# Patient Record
Sex: Female | Born: 1990 | Race: White | Hispanic: No | Marital: Single | Smoking: Former smoker
Health system: Southern US, Community
[De-identification: ages and names within clinical notes are randomized; demographics above are authoritative.]

## PROBLEM LIST (undated history)

## (undated) DIAGNOSIS — R519 Headache, unspecified: Secondary | ICD-10-CM

## (undated) DIAGNOSIS — R51 Headache: Secondary | ICD-10-CM

## (undated) HISTORY — PX: MOUTH SURGERY: SHX715

---
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
## 2012-03-02 NOTE — L&D Delivery Note (Signed)
Delivery Note At 7:19 PM a viable female, "Jaxson", was delivered via Vaginal, Spontaneous Delivery (Presentation: ; Occiput Anterior).  APGAR: 9, 9; weight .   Placenta status: Intact, Spontaneous.  Cord: 3 vessels with the following complications: .  Cord pH: NA Foul odor noted at delivery, with MSF noted. Infant vigorous at delivery, with no respiratory distress noted.  Anesthesia: Epidural  Episiotomy: None Lacerations: 2nd degree Suture Repair: 3.0 monocry Est. Blood Loss (mL): 200  Mom to postpartum.  Baby to skin to skin. Temp 101.8 at 6:36pm, with Unasyn 3 gm at 6:58p and Tylenol 650 mg po at 6:39p. Temp 99.8 at 7:45p. Per consult with Dr. Pennie Rushing, with observe pp temp status--would implement 24 hours of ATB if temp persists 4 hours pp. Outpatient circumcision planned. Placenta to pathology due to chorioamnionitis.  Nigel Bridgeman 09/17/2012, 7:59 PM

## 2012-04-22 ENCOUNTER — Ambulatory Visit (INDEPENDENT_AMBULATORY_CARE_PROVIDER_SITE_OTHER): Payer: BC Managed Care – PPO | Admitting: Emergency Medicine

## 2012-04-22 VITALS — BP 115/69 | HR 112 | Temp 98.1°F | Resp 16 | Ht 64.0 in | Wt 137.0 lb

## 2012-04-22 DIAGNOSIS — J209 Acute bronchitis, unspecified: Secondary | ICD-10-CM

## 2012-04-22 DIAGNOSIS — J018 Other acute sinusitis: Secondary | ICD-10-CM

## 2012-04-22 MED ORDER — CEFPROZIL 500 MG PO TABS: 500.0000 mg | ORAL_TABLET | Freq: Two times a day (BID) | ORAL | Status: AC

## 2012-04-22 NOTE — Patient Instructions (Signed)

## 2012-04-22 NOTE — Progress Notes (Signed)
[facility] [address] [place] [address] [phone]  Date:  04/22/2012   Name:  Shawanna Zanders   DOB:  January 04, 1991   MRN:  [id number]  PCP:  Tally Due, MD    Chief Complaint: Cough, Dizziness and Sore Throat   History of Present Illness:  Lindora Alviar is a 22 y.o. very pleasant female patient who presents with the following:  [redacted] weeks pregnant.  Has green nasal drainage and post nasal drip since Tuesday.  Cough productive green sputum.  No wheezing or shortness of breath.  No nausea or vomiting.  No fever or chills.  No improvement with OTC medication.  No sick contacts.  No other complaints or medical concerns.  There is no problem list on file for this patient.   Past Medical History  Diagnosis Date  . Generalized headaches     Past Surgical History  Procedure Laterality Date  . Mouth surgery      History  Substance Use Topics  . Smoking status: Current Some Day Smoker  . Smokeless tobacco: Not on file  . Alcohol Use: No     Comment: occasional    History reviewed. No pertinent family history.  No Known Allergies  Medication list has been reviewed and updated.  Current Outpatient Prescriptions on File Prior to Visit  Medication Sig Dispense Refill  . cyclobenzaprine (FLEXERIL) 10 MG tablet 1 by mouth at night as a muscular relaxant  30 tablet  0  . HYDROcodone-acetaminophen (NORCO/VICODIN) 5-325 MG per tablet Take 1 tablet by mouth every 6 (six) hours as needed for pain.  15 tablet  0  . levonorgestrel-ethinyl estradiol (NORDETTE) 0.15-30 MG-MCG tablet Take 1 tablet by mouth daily.      . ondansetron (ZOFRAN-ODT) 8 MG disintegrating tablet Take 1 tablet (8 mg total) by mouth every 8 (eight) hours as needed for nausea.  16 tablet  0  . promethazine (PHENERGAN) 25 MG tablet Take 1 tablet (25 mg total) by mouth every 6 (six) hours as needed for nausea.  10 tablet  0   No current facility-administered medications on file prior to  visit.    Review of Systems:  As per HPI, otherwise negative.    Physical Examination: Filed Vitals:   04/22/12 1638  BP: 115/69  Pulse: 112  Temp: 98.1 F (36.7 C)  Resp: 16   Filed Vitals:   04/22/12 1638  Height: 5\' 4"  (1.626 m)  Weight: 137 lb (62.143 kg)   Body mass index is 23.5 kg/(m^2). Ideal Body Weight: Weight in (lb) to have BMI = 25: 145.3  GEN: WDWN, NAD, Non-toxic, A & O x 3 HEENT: Atraumatic, Normocephalic. Neck supple. No masses, No LAD. Ears and Nose: No external deformity. CV: RRR, No M/G/R. No JVD. No thrill. No extra heart sounds. PULM: CTA B, no wheezes, crackles, rhonchi. No retractions. No resp. distress. No accessory muscle use. ABD: S, NT, ND, +BS. No rebound. No HSM. EXTR: No c/c/e NEURO Normal gait.  PSYCH: Normally interactive. Conversant. Not depressed or anxious appearing.  Calm demeanor.    Assessment and Plan: Sinusitis Bronchitis Incidental pregnancy cefzil Robitussin DM Sudafed Follow up as needed  Carmelina Dane, MD

## 2012-05-17 ENCOUNTER — Telehealth: Payer: Self-pay | Admitting: Obstetrics and Gynecology

## 2012-05-17 NOTE — Telephone Encounter (Signed)
Spoke with pt rgd msg pt states had missed call from office advised pt call was reminder of up coming appt pt voice understanding

## 2012-05-18 ENCOUNTER — Other Ambulatory Visit: Payer: Self-pay

## 2012-05-18 LAB — OB RESULTS CONSOLE HGB/HCT, BLOOD
HCT: 38 %
Hemoglobin: 12.7 g/dL

## 2012-05-18 LAB — OB RESULTS CONSOLE GC/CHLAMYDIA: Gonorrhea: NEGATIVE

## 2012-05-18 LAB — OB RESULTS CONSOLE HIV ANTIBODY (ROUTINE TESTING): HIV: NONREACTIVE

## 2012-05-19 LAB — PRENATAL PANEL VII
Antibody Screen: NEGATIVE
Basophils Absolute: 0 10*3/uL (ref 0.0–0.1)
Eosinophils Absolute: 0.1 10*3/uL (ref 0.0–0.7)
Eosinophils Relative: 1 % (ref 0–5)
Lymphocytes Relative: 20 % (ref 12–46)
MCH: 30.2 pg (ref 26.0–34.0)
MCV: 90.2 fL (ref 78.0–100.0)
Neutrophils Relative %: 71 % (ref 43–77)
Platelets: 210 10*3/uL (ref 150–400)
RDW: 13.5 % (ref 11.5–15.5)
WBC: 8.4 10*3/uL (ref 4.0–10.5)

## 2012-05-19 LAB — AFP, QUAD SCREEN
HCG, Total: 7412 m[IU]/mL
INH: 169.8 pg/mL
Interpretation-AFP: NEGATIVE
MoM for hCG: 0.55
Osb Risk: 1:17500 {titer}
Tri 18 Scr Risk Est: NEGATIVE
uE3 Mom: 0.74
uE3 Value: 1.6 ng/mL

## 2012-05-19 LAB — GC/CHLAMYDIA PROBE AMP, URINE: Chlamydia, Swab/Urine, PCR: NEGATIVE

## 2012-05-23 ENCOUNTER — Encounter: Payer: BC Managed Care – PPO | Admitting: Obstetrics

## 2012-07-03 ENCOUNTER — Ambulatory Visit (INDEPENDENT_AMBULATORY_CARE_PROVIDER_SITE_OTHER): Payer: BC Managed Care – PPO | Admitting: Family Medicine

## 2012-07-03 VITALS — BP 116/73 | HR 98 | Temp 97.8°F | Resp 16 | Ht 64.0 in | Wt 159.0 lb

## 2012-07-03 DIAGNOSIS — K5289 Other specified noninfective gastroenteritis and colitis: Secondary | ICD-10-CM

## 2012-07-03 DIAGNOSIS — K529 Noninfective gastroenteritis and colitis, unspecified: Secondary | ICD-10-CM

## 2012-07-03 DIAGNOSIS — Z3490 Encounter for supervision of normal pregnancy, unspecified, unspecified trimester: Secondary | ICD-10-CM

## 2012-07-03 MED ORDER — ONDANSETRON 4 MG PO TBDP: ORAL_TABLET | ORAL | Status: DC

## 2012-07-03 NOTE — Progress Notes (Signed)
Subjective: 22 year old pregnant female gravida 1 para 0 EDC September 15 2012. Goes to an OB/GYN group at [facility]. She was nauseated last night after going to a baseball game. She had he been a hamburger and fries. She slept okay, but this morning started started vomiting multiple times. Her last episode of vomiting was about a 1 hour ago. She took her Zofran at about 8 AM. She has gotten some water down her. No abdominal pain. No vaginal bleeding. No diarrhea.  Objective: Uncomfortable-appearing young lady. Throat clear. Tongue moist. Neck supple without nodes. Heart was tachycardic. Chest is clear. Abdomen has bowel sounds present. Abdomen is soft and nontender. She is pregnant, with a size appropriate for her with 29 weeks.  Assessment: Acute gastroenteritis, probably noro virus Intrauterine pregnancy [redacted] weeks gestation  Plan: Will go ahead and give her a Zofran here. She already has Zofran at home, but I will prescribe some Zofran ODT if she needs it. Push fluids.

## 2012-07-03 NOTE — Patient Instructions (Signed)
Drink lots of fluids. Avoid dairy products. When the stomach feels calmed down make progress diet by taking a little crackers and toast and applesauce and bananas, but progressed to more solid food only when the stomach is more calmed down. In the event of more acute abdominal pain go to the emergency room or come back in here.  Use Zofran 4 mg every 4-6 hours only if needed for nausea or vomiting

## 2012-09-11 ENCOUNTER — Encounter (HOSPITAL_COMMUNITY): Payer: Self-pay

## 2012-09-11 ENCOUNTER — Inpatient Hospital Stay (HOSPITAL_COMMUNITY)
Admission: AD | Admit: 2012-09-11 | Discharge: 2012-09-11 | Disposition: A | Discharge status: Home / Self Care | Payer: BC Managed Care – PPO | Source: Ambulatory Visit | Attending: Obstetrics and Gynecology | Admitting: Obstetrics and Gynecology

## 2012-09-11 DIAGNOSIS — O471 False labor at or after 37 completed weeks of gestation: Secondary | ICD-10-CM

## 2012-09-11 DIAGNOSIS — O479 False labor, unspecified: Secondary | ICD-10-CM | POA: Insufficient documentation

## 2012-09-11 MED ORDER — ZOLPIDEM TARTRATE 5 MG PO TABS
5.0000 mg | ORAL_TABLET | Freq: Once | ORAL | Status: AC
  Administered 2012-09-11: 5 mg via ORAL
  Filled 2012-09-11: qty 1

## 2012-09-11 MED ORDER — OXYCODONE-ACETAMINOPHEN 5-325 MG PO TABS
2.0000 | ORAL_TABLET | Freq: Once | ORAL | Status: AC
  Administered 2012-09-11: 2 via ORAL
  Filled 2012-09-11: qty 2

## 2012-09-11 NOTE — MAU Provider Note (Signed)
History    CSN: [id number]  Arrival date and time: 09/11/12 4098   First Provider Initiated Contact with Patient 09/11/12 [phone]      Chief Complaint  Patient presents with  . Contractions   HPI Pt presents to MAU at [redacted]w[redacted]d gestation with c/o regular uterine contractions which began earlier in the day prior to admission and have increased in frequency and intensity since onset.  Denies ROM or bldg.  Reports active fetus.  Pregnancy essentially uncomplicated.  Reports some contd intermittent nausea/vomiting but has not used Zofran in the past 3 days.  Most recent SVE was closed/thick on 08/31/12.  OB History   Grav Para Term Preterm Abortions TAB SAB Ect Mult Living   1               Past Medical History  Diagnosis Date  . Generalized headaches     Past Surgical History  Procedure Laterality Date  . Mouth surgery      History reviewed. No pertinent family history.  History  Substance Use Topics  . Smoking status: Former Games developer  . Smokeless tobacco: Not on file  . Alcohol Use: No     Comment: occasional    Allergies: No Known Allergies  Prescriptions prior to admission  Medication Sig Dispense Refill  . ondansetron (ZOFRAN-ODT) 8 MG disintegrating tablet Take 1 tablet (8 mg total) by mouth every 8 (eight) hours as needed for nausea.  16 tablet  0  . cyclobenzaprine (FLEXERIL) 10 MG tablet 1 by mouth at night as a muscular relaxant  30 tablet  0  . HYDROcodone-acetaminophen (NORCO/VICODIN) 5-325 MG per tablet Take 1 tablet by mouth every 6 (six) hours as needed for pain.  15 tablet  0  . levonorgestrel-ethinyl estradiol (NORDETTE) 0.15-30 MG-MCG tablet Take 1 tablet by mouth daily.      . ondansetron (ZOFRAN ODT) 4 MG disintegrating tablet Use every 4-6 hours if needed for nausea and vomiting  12 tablet  0  . promethazine (PHENERGAN) 25 MG tablet Take 1 tablet (25 mg total) by mouth every 6 (six) hours as needed for nausea.  10 tablet  0    Review of Systems   Constitutional: Negative.   HENT: Negative.   Eyes: Negative.   Respiratory: Negative.   Cardiovascular: Negative.   Gastrointestinal: Negative.   Genitourinary: Negative.   Musculoskeletal: Negative.   Skin: Negative.   Neurological: Negative.   Endo/Heme/Allergies: Negative.   Psychiatric/Behavioral: Negative.    Physical Exam   Blood pressure 121/73, pulse 121, temperature 97.8 F (36.6 C), temperature source Oral, resp. rate 18, height 5\' 3"  (1.6 m), weight 79.833 kg (176 lb), last menstrual period 12/11/2011.  Physical Exam  Constitutional: She is oriented to person, place, and time. She appears well-developed and well-nourished.  HENT:  Head: Normocephalic and atraumatic.  Right Ear: External ear normal.  Left Ear: External ear normal.  Nose: Nose normal.  Eyes: Conjunctivae are normal. Pupils are equal, round, and reactive to light.  Neck: Normal range of motion. Neck supple.  Cardiovascular: Normal rate, regular rhythm and intact distal pulses.   Respiratory: Effort normal and breath sounds normal.  GI: Soft. Bowel sounds are normal. There is no tenderness.  Genitourinary: Uterus normal.  SVE 1.5/70%/-2/vtx/soft/midline.  Speculum exam deferred.  Uterus soft, gravid, NT.    Musculoskeletal: Normal range of motion.  Neurological: She is alert and oriented to person, place, and time. She has normal reflexes.  Skin: Skin is warm and dry.  Psychiatric:  She has a normal mood and affect. Her behavior is normal.   FHR baseline 120bpm; variability-moderate; accels-present; decels-absent.  FHR Cat 1. Toco: UCs every 4-5 mins and mild to palpation.  Pt talking through some UCs.    MAU Course  Procedures   Assessment and Plan  IUP at 39w 2d Early vs false labor  Pt to ambulate x 1hr and if no cervical change will discharge to home.   Dana Buchanan O. 09/11/2012, 3:15 AM

## 2012-09-11 NOTE — MAU Note (Signed)
Contractions all day, regular for 4 to 5 hours, denies vaginal bleeding denies loss of fluid, positive fetal movement, denies problems during the pregnancy.

## 2012-09-11 NOTE — MAU Note (Signed)
Plan of care discussed with patient to walk for one hour and be rechecked, patient and family verbalized an understanding.

## 2012-09-11 NOTE — Progress Notes (Signed)
Subjective: Pt returned from walking and states she thinks contractions may be somewhat stronger but no increase in frequency and may be decreased in duration.  Denies ROM or bldg.  Active fetus.    Objective: Pt continues to walk and talk through contractions.   FHR baseline 135bpm; variability-moderate; accels-moderate; decels-none. Toco: Contractions every 5 mins, mild to palpation. SVE: 1.5/70/-2/vtx/midline-no change  Assessment: IUP at 39w 2d False labor  Plan: Will discharge to home. RBA therapeutic rest d/w pt and significant other and they desire to proceed. Ambien 5mg  and Percocet 5/325-2 tabs given. Rev s/s labor. Rev fetal kick counts F/U as sched at [facility] on Thursday, 09/15/12.

## 2012-09-15 ENCOUNTER — Encounter (HOSPITAL_COMMUNITY): Payer: Self-pay | Admitting: *Deleted

## 2012-09-15 ENCOUNTER — Inpatient Hospital Stay (HOSPITAL_COMMUNITY)
Admission: AD | Admit: 2012-09-15 | Discharge: 2012-09-15 | Disposition: A | Discharge status: Home / Self Care | Payer: BC Managed Care – PPO | Source: Ambulatory Visit | Attending: Obstetrics and Gynecology | Admitting: Obstetrics and Gynecology

## 2012-09-15 DIAGNOSIS — O4690 Antepartum hemorrhage, unspecified, unspecified trimester: Secondary | ICD-10-CM | POA: Insufficient documentation

## 2012-09-15 NOTE — MAU Provider Note (Signed)
  History     CSN: [id number]  Arrival date and time: 09/15/12 1754   None     Chief Complaint  Patient presents with  . Vaginal Bleeding   HPI Comments: Pt is a G1P0 at [redacted]w[redacted]d that arrived after calling office with report of heavy bleeding after having membranes swept at office. Pt states bleeding is now light, but was "very heavy" earlier. Not wearing a pad and nothing on under clothes and nothing on chux in the bed now. Denies regular ctx, or any pain, denies gush of fluid, GFM.      Past Medical History  Diagnosis Date  . Generalized headaches     Past Surgical History  Procedure Laterality Date  . Mouth surgery      History reviewed. No pertinent family history.  History  Substance Use Topics  . Smoking status: Former Games developer  . Smokeless tobacco: Not on file  . Alcohol Use: No     Comment: occasional    Allergies: No Known Allergies  Prescriptions prior to admission  Medication Sig Dispense Refill  . ondansetron (ZOFRAN-ODT) 8 MG disintegrating tablet Take 1 tablet (8 mg total) by mouth every 8 (eight) hours as needed for nausea.  16 tablet  0  . promethazine (PHENERGAN) 25 MG tablet Take 1 tablet (25 mg total) by mouth every 6 (six) hours as needed for nausea.  10 tablet  0    Review of Systems  All other systems reviewed and are negative.   Physical Exam   Blood pressure 134/65, pulse 84, temperature 97.8 F (36.6 C), temperature source Oral, resp. rate 16, last menstrual period 12/11/2011.  Physical Exam  Nursing note and vitals reviewed. Constitutional: She is oriented to person, place, and time. She appears well-developed and well-nourished.  HENT:  Head: Normocephalic.  Eyes: Pupils are equal, round, and reactive to light.  Neck: Normal range of motion.  Cardiovascular: Normal rate, regular rhythm and normal heart sounds.   Respiratory: Effort normal and breath sounds normal.  GI: Soft. Bowel sounds are normal.  Genitourinary: Vagina normal.   Spec exam, no blood in vault, cervix appears slightly friable,  Vag exam 1cm/60/-2   Musculoskeletal: Normal range of motion.  Neurological: She is alert and oriented to person, place, and time. She has normal reflexes.  Skin: Skin is warm and dry.  Psychiatric: She has a normal mood and affect. Her behavior is normal.    MAU Course  Procedures    Assessment and Plan  IUP at [redacted]w[redacted]d C/o vag bleeding after sweeping membranes at office Spec exam normal VE =1/60/-2  Not in labor Bloody show normal dc'd home in stable condition  rv'd FKC and labor sx's   Ludie Pavlik M 09/15/2012, 6:36 PM

## 2012-09-15 NOTE — MAU Note (Signed)
Pt denies pain at this time pt states she noticed heavy vaginal bleeding about 1630. Pt states bleeding has gotten lighter

## 2012-09-17 ENCOUNTER — Encounter (HOSPITAL_COMMUNITY): Payer: Self-pay | Admitting: *Deleted

## 2012-09-17 ENCOUNTER — Inpatient Hospital Stay (HOSPITAL_COMMUNITY): Payer: BC Managed Care – PPO | Admitting: Anesthesiology

## 2012-09-17 ENCOUNTER — Encounter (HOSPITAL_COMMUNITY): Payer: Self-pay | Admitting: Anesthesiology

## 2012-09-17 ENCOUNTER — Inpatient Hospital Stay (HOSPITAL_COMMUNITY)
Admission: AD | Admit: 2012-09-17 | Discharge: 2012-09-19 | DRG: 372 | Disposition: A | Discharge status: Home / Self Care | Payer: BC Managed Care – PPO | Source: Ambulatory Visit | Attending: Obstetrics and Gynecology | Admitting: Obstetrics and Gynecology

## 2012-09-17 DIAGNOSIS — Z3490 Encounter for supervision of normal pregnancy, unspecified, unspecified trimester: Secondary | ICD-10-CM

## 2012-09-17 DIAGNOSIS — O9903 Anemia complicating the puerperium: Secondary | ICD-10-CM | POA: Diagnosis not present

## 2012-09-17 DIAGNOSIS — O4290 Premature rupture of membranes, unspecified as to length of time between rupture and onset of labor, unspecified weeks of gestation: Principal | ICD-10-CM | POA: Diagnosis present

## 2012-09-17 DIAGNOSIS — O0930 Supervision of pregnancy with insufficient antenatal care, unspecified trimester: Secondary | ICD-10-CM

## 2012-09-17 DIAGNOSIS — D649 Anemia, unspecified: Secondary | ICD-10-CM | POA: Diagnosis not present

## 2012-09-17 DIAGNOSIS — O411290 Chorioamnionitis, unspecified trimester, not applicable or unspecified: Secondary | ICD-10-CM | POA: Diagnosis not present

## 2012-09-17 DIAGNOSIS — O411090 Infection of amniotic sac and membranes, unspecified, unspecified trimester, not applicable or unspecified: Secondary | ICD-10-CM | POA: Diagnosis present

## 2012-09-17 DIAGNOSIS — IMO0001 Reserved for inherently not codable concepts without codable children: Secondary | ICD-10-CM

## 2012-09-17 LAB — CBC
HCT: 36.2 % (ref 36.0–46.0)
Hemoglobin: 11.9 g/dL — ABNORMAL LOW (ref 12.0–15.0)
MCH: 27.4 pg (ref 26.0–34.0)
MCHC: 32.9 g/dL (ref 30.0–36.0)
RDW: 13.8 % (ref 11.5–15.5)

## 2012-09-17 MED ORDER — IBUPROFEN 600 MG PO TABS
600.0000 mg | ORAL_TABLET | Freq: Four times a day (QID) | ORAL | Status: DC
  Administered 2012-09-18 – 2012-09-19 (×5): 600 mg via ORAL
  Filled 2012-09-17 (×5): qty 1

## 2012-09-17 MED ORDER — LACTATED RINGERS IV SOLN
500.0000 mL | INTRAVENOUS | Status: DC | PRN
  Administered 2012-09-17: 500 mL via INTRAVENOUS
  Administered 2012-09-17: 1000 mL via INTRAVENOUS

## 2012-09-17 MED ORDER — SODIUM CHLORIDE 0.9 % IV SOLN: 250.0000 mL | INTRAVENOUS | Status: DC | PRN

## 2012-09-17 MED ORDER — DIPHENHYDRAMINE HCL 50 MG/ML IJ SOLN: 12.5000 mg | INTRAMUSCULAR | Status: DC | PRN

## 2012-09-17 MED ORDER — EPHEDRINE 5 MG/ML INJ
10.0000 mg | INTRAVENOUS | Status: DC | PRN
  Filled 2012-09-17: qty 4
  Filled 2012-09-17: qty 2

## 2012-09-17 MED ORDER — SIMETHICONE 80 MG PO CHEW: 80.0000 mg | CHEWABLE_TABLET | ORAL | Status: DC | PRN

## 2012-09-17 MED ORDER — LACTATED RINGERS IV SOLN: 500.0000 mL | Freq: Once | INTRAVENOUS | Status: DC

## 2012-09-17 MED ORDER — EPHEDRINE 5 MG/ML INJ
10.0000 mg | INTRAVENOUS | Status: DC | PRN
  Filled 2012-09-17: qty 2

## 2012-09-17 MED ORDER — PHENYLEPHRINE 40 MCG/ML (10ML) SYRINGE FOR IV PUSH (FOR BLOOD PRESSURE SUPPORT)
80.0000 ug | PREFILLED_SYRINGE | INTRAVENOUS | Status: DC | PRN
  Filled 2012-09-17: qty 2
  Filled 2012-09-17: qty 5

## 2012-09-17 MED ORDER — PHENYLEPHRINE 40 MCG/ML (10ML) SYRINGE FOR IV PUSH (FOR BLOOD PRESSURE SUPPORT)
80.0000 ug | PREFILLED_SYRINGE | INTRAVENOUS | Status: DC | PRN
  Filled 2012-09-17: qty 2

## 2012-09-17 MED ORDER — SODIUM CHLORIDE 0.9 % IV SOLN
3.0000 g | Freq: Four times a day (QID) | INTRAVENOUS | Status: DC
  Administered 2012-09-17: 3 g via INTRAVENOUS
  Filled 2012-09-17 (×2): qty 3

## 2012-09-17 MED ORDER — DIPHENHYDRAMINE HCL 25 MG PO CAPS: 25.0000 mg | ORAL_CAPSULE | Freq: Four times a day (QID) | ORAL | Status: DC | PRN

## 2012-09-17 MED ORDER — WITCH HAZEL-GLYCERIN EX PADS: 1.0000 "application " | MEDICATED_PAD | CUTANEOUS | Status: DC | PRN

## 2012-09-17 MED ORDER — ONDANSETRON HCL 4 MG PO TABS: 4.0000 mg | ORAL_TABLET | ORAL | Status: DC | PRN

## 2012-09-17 MED ORDER — PRENATAL MULTIVITAMIN CH
1.0000 | ORAL_TABLET | Freq: Every day | ORAL | Status: DC
  Administered 2012-09-18: 1 via ORAL
  Filled 2012-09-17: qty 1

## 2012-09-17 MED ORDER — DIBUCAINE 1 % RE OINT: 1.0000 "application " | TOPICAL_OINTMENT | RECTAL | Status: DC | PRN

## 2012-09-17 MED ORDER — LIDOCAINE HCL (PF) 1 % IJ SOLN
30.0000 mL | INTRAMUSCULAR | Status: DC | PRN
  Administered 2012-09-17: 30 mL via SUBCUTANEOUS
  Filled 2012-09-17 (×2): qty 30

## 2012-09-17 MED ORDER — SODIUM BICARBONATE 8.4 % IV SOLN
INTRAVENOUS | Status: DC | PRN
  Administered 2012-09-17: 5 mL via EPIDURAL

## 2012-09-17 MED ORDER — TETANUS-DIPHTH-ACELL PERTUSSIS 5-2.5-18.5 LF-MCG/0.5 IM SUSP: 0.5000 mL | Freq: Once | INTRAMUSCULAR | Status: DC

## 2012-09-17 MED ORDER — OXYCODONE-ACETAMINOPHEN 5-325 MG PO TABS: 1.0000 | ORAL_TABLET | ORAL | Status: DC | PRN

## 2012-09-17 MED ORDER — OXYTOCIN BOLUS FROM INFUSION
500.0000 mL | INTRAVENOUS | Status: DC
  Administered 2012-09-17: 500 mL via INTRAVENOUS

## 2012-09-17 MED ORDER — OXYTOCIN 40 UNITS IN LACTATED RINGERS INFUSION - SIMPLE MED
62.5000 mL/h | INTRAVENOUS | Status: DC
  Filled 2012-09-17: qty 1000

## 2012-09-17 MED ORDER — OXYCODONE-ACETAMINOPHEN 5-325 MG PO TABS
1.0000 | ORAL_TABLET | ORAL | Status: DC | PRN
  Administered 2012-09-17 – 2012-09-19 (×7): 1 via ORAL
  Filled 2012-09-17 (×8): qty 1

## 2012-09-17 MED ORDER — FENTANYL 2.5 MCG/ML BUPIVACAINE 1/10 % EPIDURAL INFUSION (WH - ANES)
14.0000 mL/h | INTRAMUSCULAR | Status: DC | PRN
  Administered 2012-09-17 (×2): 14 mL/h via EPIDURAL
  Filled 2012-09-17 (×2): qty 125

## 2012-09-17 MED ORDER — CITRIC ACID-SODIUM CITRATE 334-500 MG/5ML PO SOLN: 30.0000 mL | ORAL | Status: DC | PRN

## 2012-09-17 MED ORDER — ONDANSETRON HCL 4 MG/2ML IJ SOLN
4.0000 mg | Freq: Once | INTRAMUSCULAR | Status: AC
  Administered 2012-09-17: 4 mg via INTRAVENOUS

## 2012-09-17 MED ORDER — ZOLPIDEM TARTRATE 5 MG PO TABS
5.0000 mg | ORAL_TABLET | Freq: Every evening | ORAL | Status: DC | PRN
  Administered 2012-09-19: 5 mg via ORAL
  Filled 2012-09-17: qty 1

## 2012-09-17 MED ORDER — LANOLIN HYDROUS EX OINT: TOPICAL_OINTMENT | CUTANEOUS | Status: DC | PRN

## 2012-09-17 MED ORDER — SENNOSIDES-DOCUSATE SODIUM 8.6-50 MG PO TABS
2.0000 | ORAL_TABLET | Freq: Every day | ORAL | Status: DC
  Administered 2012-09-18: 2 via ORAL

## 2012-09-17 MED ORDER — ONDANSETRON HCL 4 MG/2ML IJ SOLN: 4.0000 mg | INTRAMUSCULAR | Status: DC | PRN

## 2012-09-17 MED ORDER — IBUPROFEN 600 MG PO TABS
600.0000 mg | ORAL_TABLET | Freq: Four times a day (QID) | ORAL | Status: DC | PRN
  Administered 2012-09-17: 600 mg via ORAL
  Filled 2012-09-17: qty 1

## 2012-09-17 MED ORDER — LACTATED RINGERS IV SOLN
INTRAVENOUS | Status: DC
  Administered 2012-09-17 (×2): via INTRAVENOUS

## 2012-09-17 MED ORDER — ONDANSETRON HCL 4 MG/2ML IJ SOLN
4.0000 mg | Freq: Four times a day (QID) | INTRAMUSCULAR | Status: DC | PRN
  Administered 2012-09-17: 4 mg via INTRAVENOUS
  Filled 2012-09-17 (×2): qty 2

## 2012-09-17 MED ORDER — ACETAMINOPHEN 325 MG PO TABS
650.0000 mg | ORAL_TABLET | ORAL | Status: DC | PRN
  Administered 2012-09-17: 650 mg via ORAL
  Filled 2012-09-17: qty 2

## 2012-09-17 MED ORDER — SODIUM CHLORIDE 0.9 % IJ SOLN: 3.0000 mL | INTRAMUSCULAR | Status: DC | PRN

## 2012-09-17 MED ORDER — SODIUM CHLORIDE 0.9 % IJ SOLN: 3.0000 mL | Freq: Two times a day (BID) | INTRAMUSCULAR | Status: DC

## 2012-09-17 MED ORDER — BENZOCAINE-MENTHOL 20-0.5 % EX AERO: 1.0000 "application " | INHALATION_SPRAY | CUTANEOUS | Status: DC | PRN

## 2012-09-17 MED ORDER — FENTANYL CITRATE 0.05 MG/ML IJ SOLN
100.0000 ug | INTRAMUSCULAR | Status: DC | PRN
  Administered 2012-09-17: 100 ug via INTRAVENOUS
  Filled 2012-09-17: qty 2

## 2012-09-17 NOTE — Anesthesia Preprocedure Evaluation (Addendum)

## 2012-09-17 NOTE — Progress Notes (Signed)
  Subjective: Pt is comfortable with her epidural.  Recently had an episode of nausea. Zofran 4mg  IV was given with good relief.  Objective: BP 131/65  Pulse 141  Temp(Src) 98.9 F (37.2 C) (Oral)  Resp 18  Ht 5\' 3"  (1.6 m)  Wt 175 lb (79.379 kg)  BMI 31.01 kg/m2  SpO2 99%  LMP 12/11/2011      FHT:  Cat I UC:   regular, every 2.5-3 minutes SVE:   Dilation: 3 Effacement (%): 90 Station: -1 Exam by:: Kiley Torrence cnm  Assessment / Plan:  Labor: Active labor Preeclampsia: no s/s Fetal Wellbeing: Pain Control: Epidural I/D: GBS neg; SROM x 6.5 hours Anticipated MOD: SVD   Dana Buchanan 09/17/2012, 2:30 PM

## 2012-09-17 NOTE — MAU Note (Signed)
?   SROM @ 0900, greenish brown fluid, uc's started after fluid leaking, denies bleeding.

## 2012-09-17 NOTE — Progress Notes (Signed)
Called j. oxley cnm - re: fhr increasing as well as pt's temp - no new orders at present - will continue to monitor

## 2012-09-17 NOTE — Anesthesia Procedure Notes (Signed)

## 2012-09-17 NOTE — Progress Notes (Signed)
  Subjective: Pt is having difficulty managing UCs and is requesting epidural.  Objective: BP 147/69  Pulse 83  Temp(Src) 97.9 F (36.6 C) (Oral)  Resp 18  Ht 5\' 3"  (1.6 m)  Wt 175 lb (79.379 kg)  BMI 31.01 kg/m2  LMP 12/11/2011      FHT:  Cat I UC:   regular, every 2-4 minutes  SVE:   Dilation: 2 Effacement (%): 70 Station: -2 Exam by:: Carolyn Maniscalco cnm  Assessment / Plan:  Labor: Early vs active labor Preeclampsia: no s/s Fetal Wellbeing: Cat I Pain Control: Epidural requested I/D: GBS neg; SROM @ 0800 Anticipated MOD: SVD   Janis Cuffe 09/17/2012, 12:15 PM

## 2012-09-17 NOTE — Progress Notes (Signed)
  Subjective: Pt comfortable sitting up in bed.  RN had just checked patient's cervix.    Objective: BP 134/77  Pulse 104  Temp(Src) 99.6 F (37.6 C) (Oral)  Resp 18  Ht 5\' 3"  (1.6 m)  Wt 175 lb (79.379 kg)  BMI 31.01 kg/m2  SpO2 99%  LMP 12/11/2011      FHT:  Cat II with 1 variable @1628  spontaneously resolved with mod variability UC:   regular, every 2 minutes SVE:   Dilation: 3 Effacement (%): 90 Station: -1 Exam by:: Kailea Dannemiller cnm  Assessment / Plan:  Labor: Active labor Preeclampsia: no s/s Fetal Wellbeing: Cat II Pain Control: Epidural I/D: GBS neg; SROM x 8.5 hours Anticipated MOD: SVD   Dana Buchanan 09/17/2012, 4:48 PM

## 2012-09-17 NOTE — H&P (Signed)
Dana Buchanan is a 22 y.o. female, G1P0000 at [redacted]w[redacted]d, presenting for SROM, greenish-brown fluid, around 0900 this am.  Soon after intense UCs began.    Patient Active Problem List   Diagnosis Date Noted  . Currently pregnant 09/17/2012  . PROM (premature rupture of membranes) 09/17/2012  . Late prenatal care 09/17/2012    History of present pregnancy: Patient entered care at 24 weeks.   EDC of 09/16/12 was established by LMP.   Anatomy scan:  24 weeks, with normal findings and an anterior placenta.   Additional [place] evaluations:  none.   Significant prenatal events:  none   Last evaluation:  09/15/12 at [redacted]w[redacted]d  OB History   Grav Para Term Preterm Abortions TAB SAB Ect Mult Living   1              Past Medical History  Diagnosis Date  . Generalized headaches    Past Surgical History  Procedure Laterality Date  . Mouth surgery     Family History: family history is not on file. Social History:  reports that she has quit smoking. She has never used smokeless tobacco. She reports that she does not drink alcohol or use illicit drugs.   Prenatal Transfer Tool  Maternal Diabetes: No Genetic Screening: Normal Maternal Ultrasounds/Referrals: Normal Fetal Ultrasounds or other Referrals:  None Maternal Substance Abuse:  No Significant Maternal Medications:  None Significant Maternal Lab Results: Lab values include: Group B Strep negative    ROS: see HPI above, all other systems are negative  No Known Allergies   Dilation: 2 Effacement (%): 70 Station: -2 Exam by:: J. Saroya Riccobono CNM Blood pressure 130/79, pulse 107, temperature 98 F (36.7 C), temperature source Oral, resp. rate 20, height 5\' 3"  (1.6 m), last menstrual period 12/11/2011.  Chest clear Heart RRR without murmur Abd gravid, NT Ext: WNL  FHR: Reactive NST UCs:  Q 3-5 mion  Prenatal labs: ABO, Rh: A/POS/-- (03/19 1143) Antibody: NEG (03/19 1143) Rubella:   Immune RPR: NON REAC (03/19 1143)  HBsAg: NEGATIVE  (03/19 1143)  HIV: NON REACTIVE (03/19 1143)  GBS: Negative (06/16 0000) Sickle cell/Hgb electrophoresis:  n/a Pap:  06/03/12 - ASCUS GC:  neg Chlamydia:  neg Genetic screenings:  AFP Quad normal Glucola:  65 Other:  none   Assessment/Plan: IUP at [redacted]w[redacted]d PROM vs SROM with active labor GBS neg Per pt, light mec stained fluid Desires no epidural  Admit to BS per c/w Dr. Pennie Rushing as attending MD Routine [facility] order To augment labor if not in active labor   Rowan Blase, MSN 09/17/2012, 11:13 AM

## 2012-09-18 LAB — CBC
Platelets: 165 10*3/uL (ref 150–400)
RBC: 3.5 MIL/uL — ABNORMAL LOW (ref 3.87–5.11)
RDW: 13.9 % (ref 11.5–15.5)
WBC: 18.4 10*3/uL — ABNORMAL HIGH (ref 4.0–10.5)

## 2012-09-18 MED ORDER — POLYSACCHARIDE IRON COMPLEX 150 MG PO CAPS
150.0000 mg | ORAL_CAPSULE | Freq: Every day | ORAL | Status: DC
  Administered 2012-09-18 – 2012-09-19 (×2): 150 mg via ORAL
  Filled 2012-09-18 (×2): qty 1

## 2012-09-18 NOTE — Progress Notes (Signed)
Post Partum Day 1: S/P SVD with a 2nd degree lac  Subjective: Patient up ad lib, denies HA, SOB, tachycardia, syncope or dizziness.  Up voiding independently, + flatus. Feeding:  breastfeeding Contraceptive plan:   Unknown at this time  Objective: Blood pressure 101/64, pulse 96, temperature 97.6 F (36.4 C), temperature source Oral, resp. rate 18, height 5\' 3"  (1.6 m), weight 175 lb (79.379 kg), last menstrual period 12/11/2011, SpO2 99.00%, unknown if currently breastfeeding.  Physical Exam:  General: alert, cooperative and no distress Lochia: appropriate Uterine Fundus: firm Incision: healing well DVT Evaluation: No evidence of DVT seen on physical exam. Negative Homan's sign.   Recent Labs  09/17/12 1115 09/18/12 0550  HGB 11.9* 9.6*  HCT 36.2 29.4*    Assessment/Plan: S/P Vaginal delivery day 1 Asymptomatic anemia Fe BID ordered Continue current care Plan for discharge tomorrow Pediatrician may require 48 hour obs for baby.   LOS: 1 day   Dana Buchanan 09/18/2012, 10:23 AM

## 2012-09-18 NOTE — Anesthesia Postprocedure Evaluation (Signed)
Anesthesia Post Note  Patient: [name]  Procedure(s) Performed: * No procedures listed *  Anesthesia type: Epidural  Patient location: Mother/Baby  Post pain: Pain level controlled  Post assessment: Post-op Vital signs reviewed  Last Vitals:  Filed Vitals:   09/18/12 0620  BP: 101/64  Pulse: 96  Temp: 36.4 C  Resp: 18    Post vital signs: Reviewed  Level of consciousness:alert  Complications: No apparent anesthesia complications Anesthesia Post-op Note  Patient: [name]  Procedure(s) Performed: * No procedures listed *  Patient Location: PACU and Mother/Baby  Anesthesia Type:Epidural  Level of Consciousness: awake  Airway and Oxygen Therapy: Patient Spontanous Breathing  Post-op Pain: none  Post-op Assessment: Post-op Vital signs reviewed  Post-op Vital Signs: Reviewed and stable  Complications: No apparent anesthesia complications

## 2012-09-19 MED ORDER — POLYSACCHARIDE IRON COMPLEX 150 MG PO CAPS: 150.0000 mg | ORAL_CAPSULE | Freq: Every day | ORAL | Status: AC

## 2012-09-19 MED ORDER — OXYCODONE-ACETAMINOPHEN 5-325 MG PO TABS: 1.0000 | ORAL_TABLET | ORAL | Status: AC | PRN

## 2012-09-19 MED ORDER — IBUPROFEN 600 MG PO TABS: 600.0000 mg | ORAL_TABLET | Freq: Four times a day (QID) | ORAL | Status: AC

## 2012-09-19 NOTE — Discharge Summary (Signed)
Vaginal Delivery Discharge Summary  Dana Buchanan  DOB:    10-15-1990 MRN:    [id number] CSN:    [id number]  Date of admission:                  09/17/12  Date of discharge:                   09/19/12  Procedures this admission: SVD with 2nd degree lac  Date of Delivery: 09/17/12 by Nigel Bridgeman  Newborn Data:  Live born female  Birth Weight: 8 lb 1.2 oz (3663 g) APGAR: 9, 9  Home with mother. Name: "Jaxon" Circumcision Plan: outpatient  History of Present Illness:  Ms. Dana Buchanan is a 22 y.o. female, G1P1001, who presents at [redacted]w[redacted]d weeks gestation. The patient has been followed at the [facility] of [facility]. She was admitted rupture of membranes. Her pregnancy has been complicated by:   Patient Active Problem List   Diagnosis Date Noted  . Currently pregnant 09/17/2012  . PROM (premature rupture of membranes) 09/17/2012  . Late prenatal care 09/17/2012  . Chorioamnionitis 09/17/2012  . Vaginal delivery 09/17/2012  . Second-degree perineal laceration, with delivery 09/17/2012   Hospital course:  The patient was admitted for SROM.   Her labor was complicated by chorio. She proceeded to have a vaginal delivery of a healthy infant. Her delivery was not complicated. Her postpartum course was not complicated. Hgb of 9.6, rx'd Fe. She was discharged to home on postpartum day 2 doing well.  Feeding:  breast  Contraception:  Mirena or Nexplanon  Discharge hemoglobin:  Hemoglobin  Date Value Range Status  09/18/2012 9.6* 12.0 - 15.0 g/dL Final     REPEATED TO VERIFY     DELTA CHECK NOTED  05/18/2012 12.7   Final  11/20/2011 14.7  12.2 - 16.2 g/dL Final     HCT  Date Value Range Status  09/18/2012 29.4* 36.0 - 46.0 % Final  05/18/2012 38   Final     HCT, POC  Date Value Range Status  11/20/2011 46.0  37.7 - 47.9 % Final    Discharge Physical Exam:   General: alert, cooperative and no distress Lochia:  appropriate Uterine Fundus: firm Incision: healing well DVT Evaluation: No evidence of DVT seen on physical exam. Negative Homan's sign.  Intrapartum Procedures: spontaneous vaginal delivery Postpartum Procedures: none Complications-Operative and Postpartum: 2nd degree perineal laceration  Discharge Diagnoses: Term Pregnancy-delivered, Amnionitis and asymptomatic anemia  Discharge Information:  Activity:           pelvic rest Diet:                routine Medications: PNV, Ibuprofen, Iron and Percocet Condition:      stable Instructions:  refer to practice specific booklet Discharge to: home  Follow-up Information   Follow up with [facility]. Schedule an appointment as soon as possible for a visit in 5 weeks. (Call with any question or concerns.)    Contact information:   [address] [place] [address] [phone]       Haroldine Laws 09/19/2012

## 2012-09-19 NOTE — Progress Notes (Signed)
Provided one percocet to pt at 0845 am for pain rated 6 in abd and back.  Unable to doc related epic being down!!!!!!!!!

## 2013-08-15 IMAGING — CT CT ABD-PELV W/ CM
1 of 2 series · 15 of 32 positions shown, 19 images · IV contrast (OMNIPAQUE 300)
Comparison: CT of the abdomen and pelvis 11/15/2011.

CLINICAL DATA: Right lower quadrant abdominal pain.

CT ABDOMEN AND PELVIS WITH CONTRAST
TECHNIQUE: Multidetector CT imaging of the abdomen and pelvis was
performed following the standard protocol during bolus
administration of intravenous contrast.
Contrast: 100mL OMNIPAQUE IOHEXOL 300 MG/ML  SOLN

[Series 2: abd/pel with · axial · 0.61mm/px · z∈[-446,-51]mm · 15 of 87 slices shown, 19 images]
[im 4/87  soft-tissue]
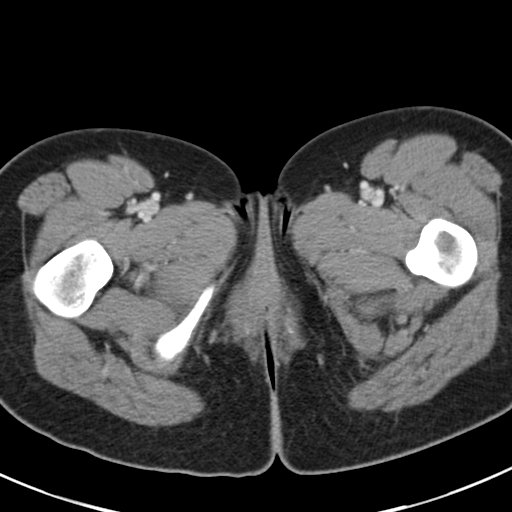
[im 4/87  bone]
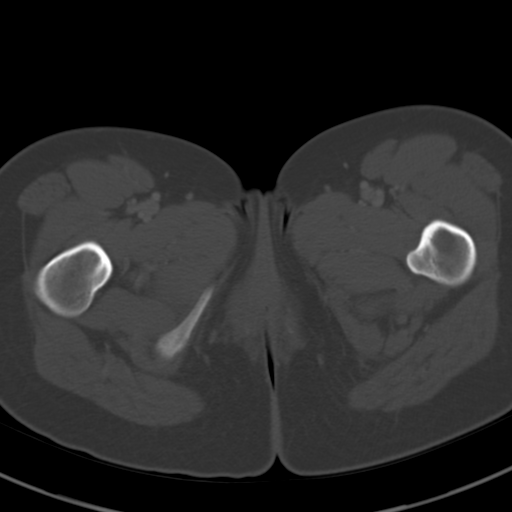
[im 11/87  soft-tissue]
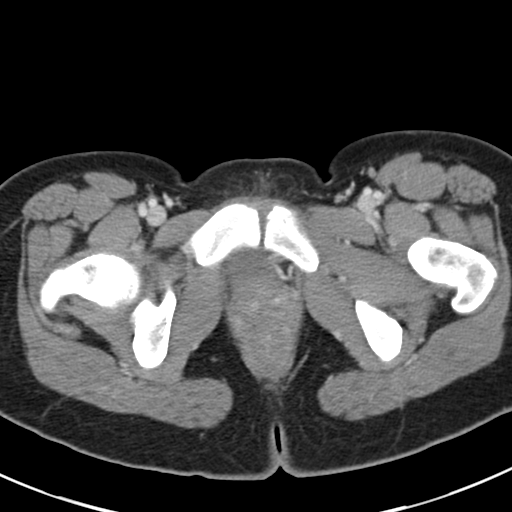
[im 18/87  soft-tissue]
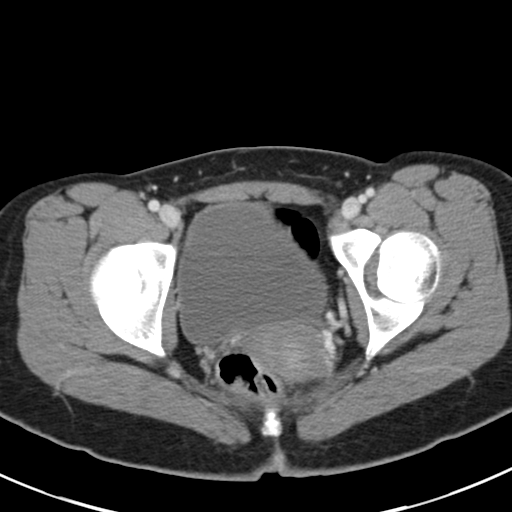
[im 26/87  soft-tissue]
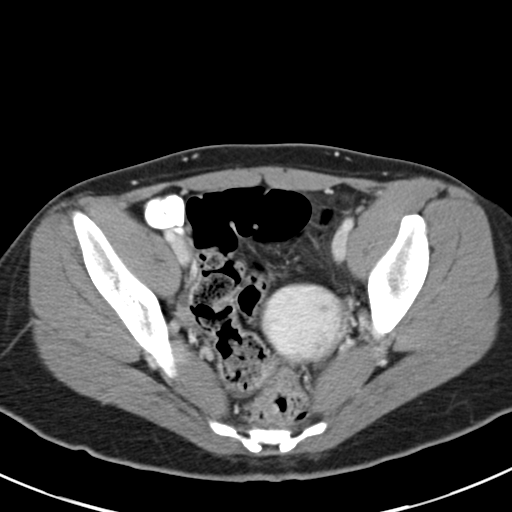
[im 29/87  soft-tissue]
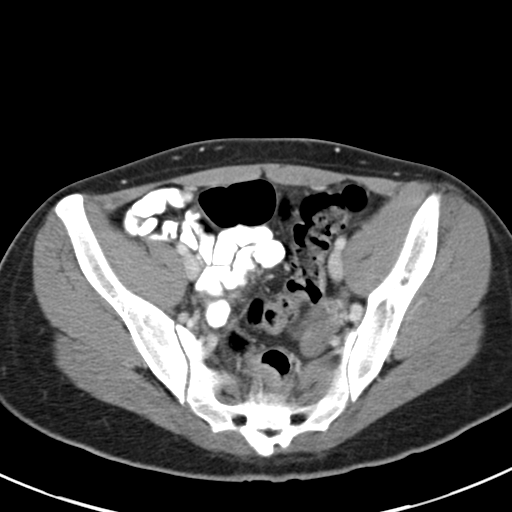
[im 36/87  soft-tissue]
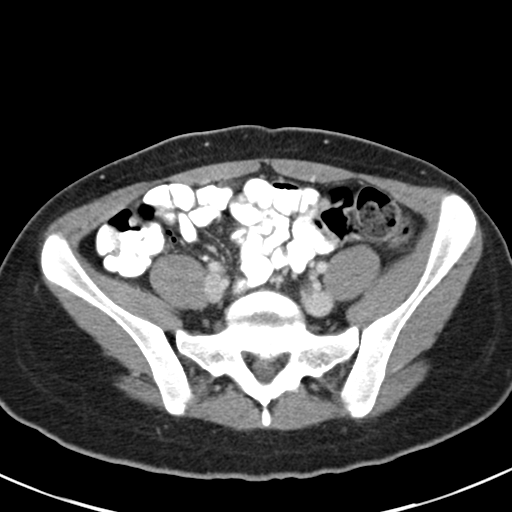
[im 44/87  soft-tissue]
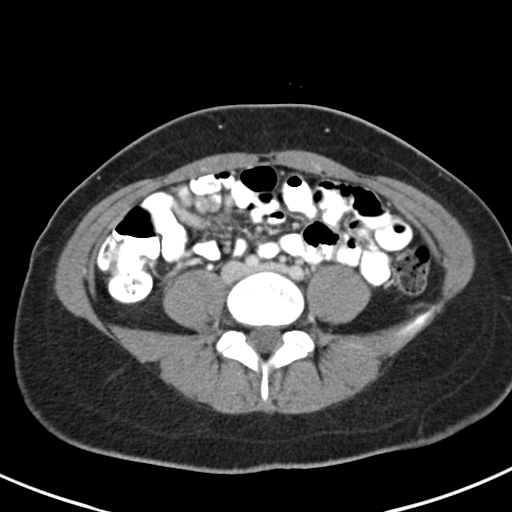
[im 51/87  soft-tissue]
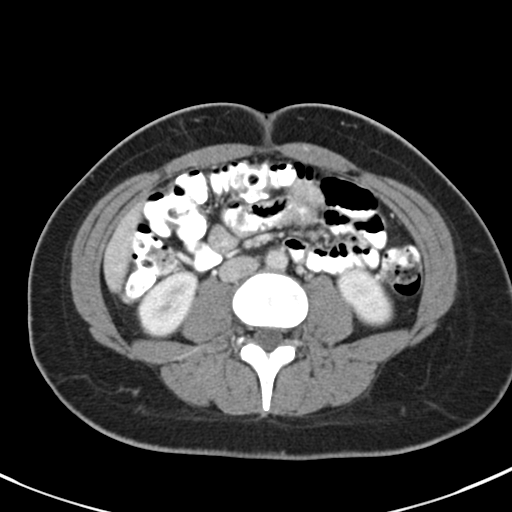
[im 58/87  soft-tissue]
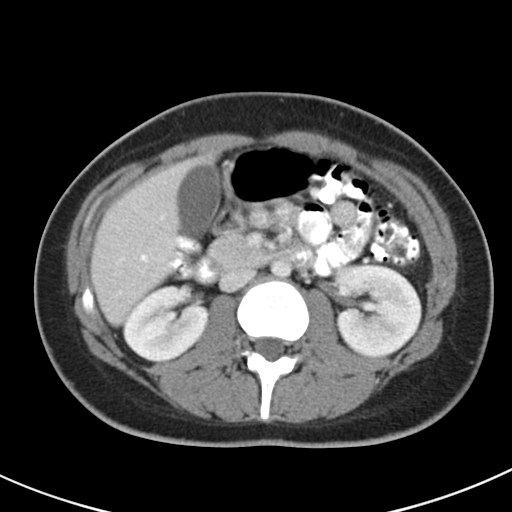
[im 58/87  bone]
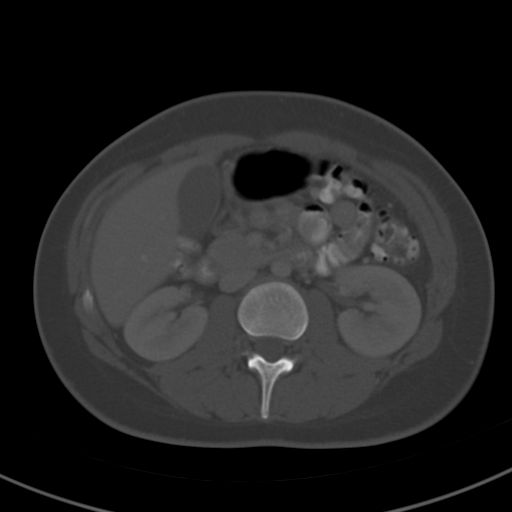
[im 61/87  soft-tissue]
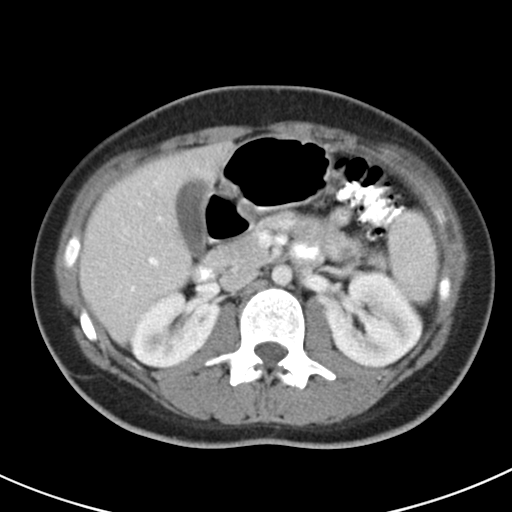
[im 69/87  soft-tissue]
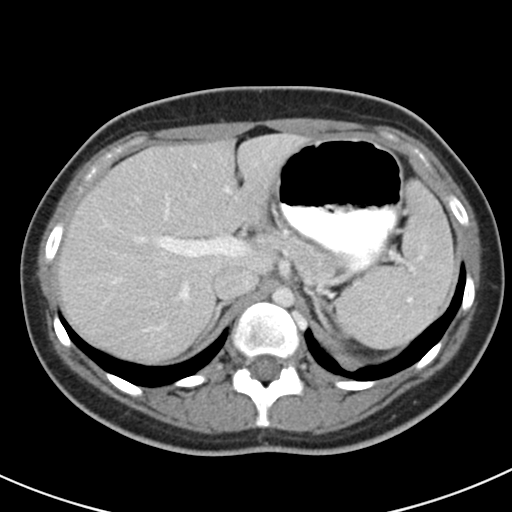
[im 72/87  lung]
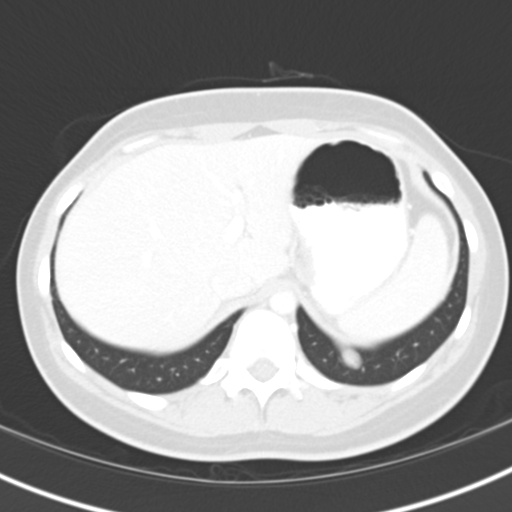
[im 76/87  soft-tissue]
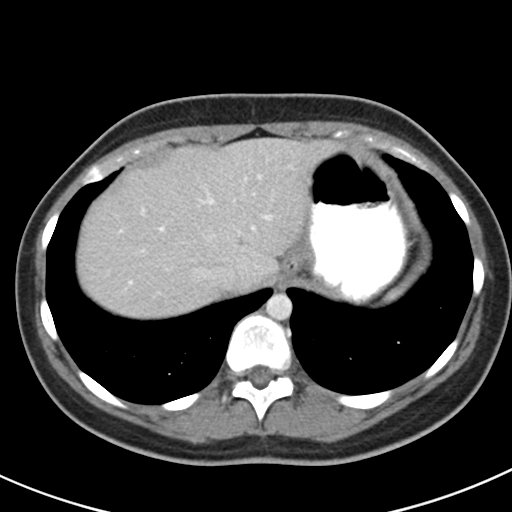
[im 76/87  lung]
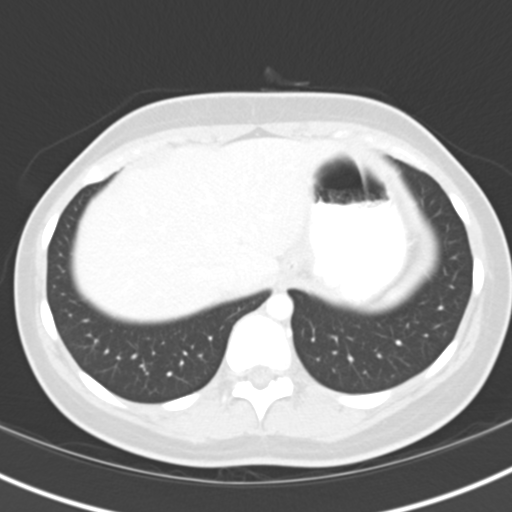
[im 79/87  lung]
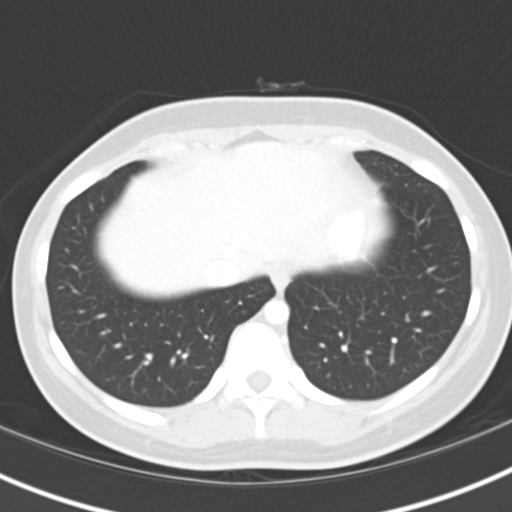
[im 83/87  soft-tissue]
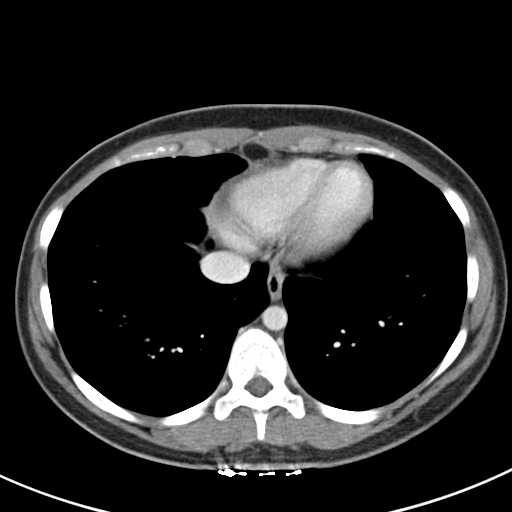
[im 83/87  lung]
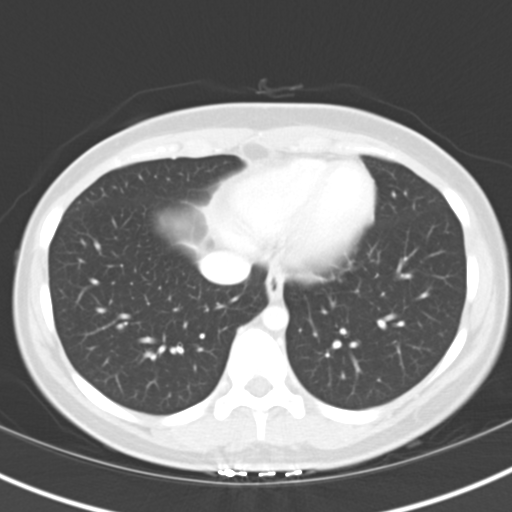

[15 of 32 positions shown; findings below may reference images not displayed]

FINDINGS: Lung Bases: Focal eventration of the left hemidiaphragm is
unchanged.

Abdomen/Pelvis:  The enhanced appearance of the liver, gallbladder,
pancreas, spleen, bilateral adrenal glands and the left kidney is
unremarkable.  In the upper pole of the right kidney there is a
cm low attenuation lesion compatible with a simple cyst.  The
appendix is normal.  There is no evidence of ascites or
pneumoperitoneum, and no pathologic distension of bowel.  No
definite pathologic lymphadenopathy identified within the abdomen
or pelvis.  Uterus, bilateral ovaries and urinary bladder are
unremarkable in appearance.

Musculoskeletal: There are no aggressive appearing lytic or blastic
lesions noted in the visualized portions of the skeleton.
IMPRESSION: 1.  No acute findings in the abdomen or pelvis to account for
patient's symptoms.
2.  Normal appendix.
3.  Small cyst in the upper pole of the right kidney.

## 2013-12-06 ENCOUNTER — Other Ambulatory Visit: Payer: Self-pay | Admitting: Obstetrics and Gynecology

## 2013-12-07 LAB — CYTOLOGY - PAP

## 2014-01-01 ENCOUNTER — Encounter (HOSPITAL_COMMUNITY): Payer: Self-pay | Admitting: *Deleted

## 2015-02-01 ENCOUNTER — Emergency Department (HOSPITAL_COMMUNITY): Payer: BLUE CROSS/BLUE SHIELD

## 2015-02-01 ENCOUNTER — Emergency Department (HOSPITAL_COMMUNITY)
Admission: EM | Admit: 2015-02-01 | Discharge: 2015-02-01 | Disposition: A | Discharge status: Home / Self Care | Payer: BLUE CROSS/BLUE SHIELD | Attending: Emergency Medicine | Admitting: Emergency Medicine

## 2015-02-01 ENCOUNTER — Encounter (HOSPITAL_COMMUNITY): Payer: Self-pay | Admitting: Emergency Medicine

## 2015-02-01 DIAGNOSIS — Z3202 Encounter for pregnancy test, result negative: Secondary | ICD-10-CM | POA: Diagnosis not present

## 2015-02-01 DIAGNOSIS — R1031 Right lower quadrant pain: Secondary | ICD-10-CM | POA: Insufficient documentation

## 2015-02-01 DIAGNOSIS — R109 Unspecified abdominal pain: Secondary | ICD-10-CM

## 2015-02-01 LAB — COMPREHENSIVE METABOLIC PANEL
ALT: 16 U/L (ref 14–54)
AST: 19 U/L (ref 15–41)
Albumin: 4.2 g/dL (ref 3.5–5.0)
Alkaline Phosphatase: 59 U/L (ref 38–126)
Anion gap: 9 (ref 5–15)
BUN: 14 mg/dL (ref 6–20)
CHLORIDE: 107 mmol/L (ref 101–111)
CO2: 24 mmol/L (ref 22–32)
CREATININE: 0.69 mg/dL (ref 0.44–1.00)
Calcium: 9.7 mg/dL (ref 8.9–10.3)
GFR calc non Af Amer: >60 mL/min (ref >60)
GLUCOSE: 95 mg/dL (ref 65–99)
Potassium: 3.8 mmol/L (ref 3.5–5.1)
SODIUM: 140 mmol/L (ref 135–145)
Total Bilirubin: 1.2 mg/dL (ref 0.3–1.2)
Total Protein: 7.4 g/dL (ref 6.5–8.1)

## 2015-02-01 LAB — URINALYSIS, ROUTINE W REFLEX MICROSCOPIC
Bilirubin Urine: NEGATIVE
GLUCOSE, UA: NEGATIVE mg/dL
HGB URINE DIPSTICK: NEGATIVE
Ketones, ur: NEGATIVE mg/dL
Leukocytes, UA: NEGATIVE
Nitrite: NEGATIVE
PH: 6.5 (ref 5.0–8.0)
Protein, ur: NEGATIVE mg/dL
SPECIFIC GRAVITY, URINE: 1.01 (ref 1.005–1.030)

## 2015-02-01 LAB — CBC WITH DIFFERENTIAL/PLATELET
Basophils Absolute: 0 10*3/uL (ref 0.0–0.1)
Basophils Relative: 0 %
EOS PCT: 3 %
Eosinophils Absolute: 0.2 10*3/uL (ref 0.0–0.7)
HCT: 43.4 % (ref 36.0–46.0)
HEMOGLOBIN: 14.6 g/dL (ref 12.0–15.0)
LYMPHS ABS: 2.7 10*3/uL (ref 0.7–4.0)
LYMPHS PCT: 33 %
MCH: 30.5 pg (ref 26.0–34.0)
MCHC: 33.6 g/dL (ref 30.0–36.0)
MCV: 90.8 fL (ref 78.0–100.0)
MONOS PCT: 6 %
Monocytes Absolute: 0.5 10*3/uL (ref 0.1–1.0)
NEUTROS PCT: 58 %
Neutro Abs: 4.7 10*3/uL (ref 1.7–7.7)
Platelets: 213 10*3/uL (ref 150–400)
RBC: 4.78 MIL/uL (ref 3.87–5.11)
RDW: 12.6 % (ref 11.5–15.5)
WBC: 8.1 10*3/uL (ref 4.0–10.5)

## 2015-02-01 LAB — LIPASE, BLOOD: Lipase: 23 U/L (ref 11–51)

## 2015-02-01 LAB — POC URINE PREG, ED: Preg Test, Ur: NEGATIVE

## 2015-02-01 MED ORDER — DEXTROSE 5 % IV SOLN
1000.0000 mg | Freq: Once | INTRAVENOUS | Status: AC
  Administered 2015-02-01: 1000 mg via INTRAVENOUS
  Filled 2015-02-01: qty 10

## 2015-02-01 MED ORDER — NAPROXEN 500 MG PO TABS: 500.0000 mg | ORAL_TABLET | Freq: Two times a day (BID) | ORAL | Status: AC

## 2015-02-01 MED ORDER — SODIUM CHLORIDE 0.9 % IV SOLN
Freq: Once | INTRAVENOUS | Status: AC
  Administered 2015-02-01: 06:00:00 via INTRAVENOUS

## 2015-02-01 MED ORDER — METHOCARBAMOL 1000 MG/10ML IJ SOLN: 1000.0000 mg | Freq: Once | INTRAMUSCULAR | Status: DC

## 2015-02-01 MED ORDER — GI COCKTAIL ~~LOC~~
30.0000 mL | Freq: Once | ORAL | Status: AC
  Administered 2015-02-01: 30 mL via ORAL
  Filled 2015-02-01: qty 30

## 2015-02-01 MED ORDER — KETOROLAC TROMETHAMINE 30 MG/ML IJ SOLN
30.0000 mg | Freq: Once | INTRAMUSCULAR | Status: AC
  Administered 2015-02-01: 30 mg via INTRAVENOUS
  Filled 2015-02-01: qty 1

## 2015-02-01 MED ORDER — HYDROCODONE-ACETAMINOPHEN 5-325 MG PO TABS
1.0000 | ORAL_TABLET | Freq: Once | ORAL | Status: AC
  Administered 2015-02-01: 1 via ORAL
  Filled 2015-02-01: qty 1

## 2015-02-01 NOTE — ED Notes (Signed)
Patient transported to X-ray 

## 2015-02-01 NOTE — ED Provider Notes (Signed)
7:33 AM Patient's reexamined. Patient with right lower rib/side pain onset yesterday. She reports similar pain in the past. Patient states pain is worsened with movement, breathing. Palpation of the area does not make it worse. Patient states pain is coming and going, states when it is there it is severe. She has had prior workup for the same pain, states that she saw a GI specialist, gallbladder problems were ruled out. Patient is tearful. She received Toradol for her pain and GI cocktail. We'll give her Vicodin. Because how severe the pain is well get CT abdomen and pelvis to rule out a kidney stone. UA and labs negative.   CT negative. Pt feels somewhat better. Pain continues. VS continue to be normal. PERC negative. Home with NSAIDs. Follow up with pcp. Most likely musculoskeletal pain.   Filed Vitals:   02/01/15 0704 02/01/15 0823 02/01/15 0915 02/01/15 0959  BP: 118/69 112/70 124/62 117/63  Pulse: 82 84 70 65  Temp: 98.8 F (37.1 C)   98.3 F (36.8 C)  TempSrc: Oral   Oral  Resp: 16  16 14   SpO2: 100% 98% 98% 99%     Jaynie Crumbleatyana Tyrika Newman, PA-C 02/01/15 1530  Gwyneth SproutWhitney Plunkett, MD 02/04/15 [phone]

## 2015-02-01 NOTE — Discharge Instructions (Signed)
Naprosyn for pain and inflammation. No strenuous activity. Follow up with your doctor for recheck.    Flank Pain Flank pain refers to pain that is located on the side of the body between the upper abdomen and the back. The pain may occur over a short period of time (acute) or may be long-term or reoccurring (chronic). It may be mild or severe. Flank pain can be caused by many things. CAUSES  Some of the more common causes of flank pain include:  Muscle strains.   Muscle spasms.   A disease of your spine (vertebral disk disease).   A lung infection (pneumonia).   Fluid around your lungs (pulmonary edema).   A kidney infection.   Kidney stones.   A very painful skin rash caused by the chickenpox virus (shingles).   Gallbladder disease.  HOME CARE INSTRUCTIONS  Home care will depend on the cause of your pain. In general,  Rest as directed by your caregiver.  Drink enough fluids to keep your urine clear or pale yellow.  Only take over-the-counter or prescription medicines as directed by your caregiver. Some medicines may help relieve the pain.  Tell your caregiver about any changes in your pain.  Follow up with your caregiver as directed. SEEK IMMEDIATE MEDICAL CARE IF:   Your pain is not controlled with medicine.   You have new or worsening symptoms.  Your pain increases.   You have abdominal pain.   You have shortness of breath.   You have persistent nausea or vomiting.   You have swelling in your abdomen.   You feel faint or pass out.   You have blood in your urine.  You have a fever or persistent symptoms for more than 2-3 days.  You have a fever and your symptoms suddenly get worse. MAKE SURE YOU:   Understand these instructions.  Will watch your condition.  Will get help right away if you are not doing well or get worse.   This information is not intended to replace advice given to you by your health care provider. Make sure you  discuss any questions you have with your health care provider.   Document Released: 04/09/2005 Document Revised: 11/11/2011 Document Reviewed: 10/01/2011 Elsevier Interactive Patient Education [employer]2016 [employer]

## 2015-02-01 NOTE — ED Notes (Signed)
Per lab, CBC hemolyzed. Please recollect, phlebotomy notified.

## 2015-02-01 NOTE — ED Notes (Signed)
Report given to Morgan, RN

## 2015-02-01 NOTE — ED Notes (Signed)
Pt co right side pain proximal to rib cage; increased pain with movement/cough/laughing; pt states onset x2 hours; states had this pain before and saw dr but no cause identified; pt observed guarding and deep breathing ; denies trauma but states she exercises regularly

## 2015-02-01 NOTE — ED Notes (Addendum)
Pt ambulated to room 24 accompanied with family and nurse. She was seen gaurding right side. States points to pain in  Right side radiating to top of hip and around to right side of back. She consulted with a GI specialist 3 years ago for gallbladder concerns but states test were negative. She denies N/V/D or fever.

## 2015-03-08 NOTE — ED Provider Notes (Addendum)
CSN: [id number]     Arrival date & time 02/01/15  0421 History   First MD Initiated Contact with Patient 02/01/15 0447     Chief Complaint  Patient presents with  . Right Side Pain     (Consider location/radiation/quality/duration/timing/severity/associated sxs/prior Treatment) HPI Comments: R rib pain for past 2 hours--has had this pain before no definitive Dx at that time Hurts more with deep breath    The history is provided by the patient.    Past Medical History  Diagnosis Date  . Generalized headaches    Past Surgical History  Procedure Laterality Date  . Mouth surgery     No family history on file. Social History  Substance Use Topics  . Smoking status: Former Games developer  . Smokeless tobacco: Never Used  . Alcohol Use: No     Comment: occasional   OB History    Gravida Para Term Preterm AB TAB SAB Ectopic Multiple Living   1 1 1       1      Review of Systems  Constitutional: Negative for fever.  Respiratory: Negative for shortness of breath.   Cardiovascular: Negative for chest pain.  Gastrointestinal: Positive for abdominal pain.  Genitourinary: Positive for flank pain.  Musculoskeletal: Positive for back pain.  Skin: Negative for wound.  All other systems reviewed and are negative.     Allergies  Review of patient's allergies indicates no known allergies.  Home Medications   Prior to Admission medications   Medication Sig Start Date End Date Taking? Authorizing Provider  ibuprofen (ADVIL,MOTRIN) 200 MG tablet Take 200 mg by mouth every 6 (six) hours as needed for moderate pain.   Yes Historical Provider, MD  ibuprofen (ADVIL,MOTRIN) 600 MG tablet Take 1 tablet (600 mg total) by mouth every 6 (six) hours. Patient not taking: Reported on 02/01/2015 09/19/12   Haroldine Laws, CNM  iron polysaccharides (NIFEREX) 150 MG capsule Take 1 capsule (150 mg total) by mouth daily. Patient not taking: Reported on 02/01/2015 09/19/12   Haroldine Laws, CNM  naproxen  (NAPROSYN) 500 MG tablet Take 1 tablet (500 mg total) by mouth 2 (two) times daily. 02/01/15   Tatyana Kirichenko, PA-C  oxyCODONE-acetaminophen (PERCOCET/ROXICET) 5-325 MG per tablet Take 1-2 tablets by mouth every 4 (four) hours as needed. Patient not taking: Reported on 02/01/2015 09/19/12   Haroldine Laws, CNM   BP 117/63 mmHg  Pulse 65  Temp(Src) 98.3 F (36.8 C) (Oral)  Resp 14  SpO2 99%  LMP  (LMP Unknown) Physical Exam  Constitutional: She appears well-developed and well-nourished.  HENT:  Head: Normocephalic.  Eyes: Pupils are equal, round, and reactive to light.  Neck: Normal range of motion.  Cardiovascular: Normal rate and regular rhythm.   Musculoskeletal: Normal range of motion.  Neurological: She is alert.  Skin: Skin is warm. No rash noted.  Nursing note and vitals reviewed.   ED Course  Procedures (including critical care time) Labs Review Labs Reviewed  COMPREHENSIVE METABOLIC PANEL  LIPASE, BLOOD  CBC WITH DIFFERENTIAL/PLATELET  URINALYSIS, ROUTINE W REFLEX MICROSCOPIC (NOT AT [facility])  CBC WITH DIFFERENTIAL/PLATELET  POC URINE PREG, ED    Imaging Review No results found. I have personally reviewed and evaluated these images and lab results as part of my medical decision-making.   EKG Interpretation None    concern fro kidney stone with obtain CT Scan   MDM   Final diagnoses:  Flank pain         Earley Favor, NP 03/08/15  1951  Earley FavorGail Lizbett Garciagarcia, NP 03/08/15 1952  Gwyneth SproutWhitney Plunkett, MD 03/08/15 [id number]  Earley FavorGail Tamea Bai, NP 03/28/15 2001  Earley FavorGail Layah Skousen, NP 03/29/15 [id number]  Gwyneth SproutWhitney Plunkett, MD 03/29/15 1527

## 2016-10-25 IMAGING — CR DG CHEST 2V
2 series · 2 of 2 positions shown · non-contrast
Comparison: 11/20/2011

CLINICAL DATA: Her right lateral chest wall pain for 2 hours

EXAM:
CHEST  2 VIEW

[w chest pa]
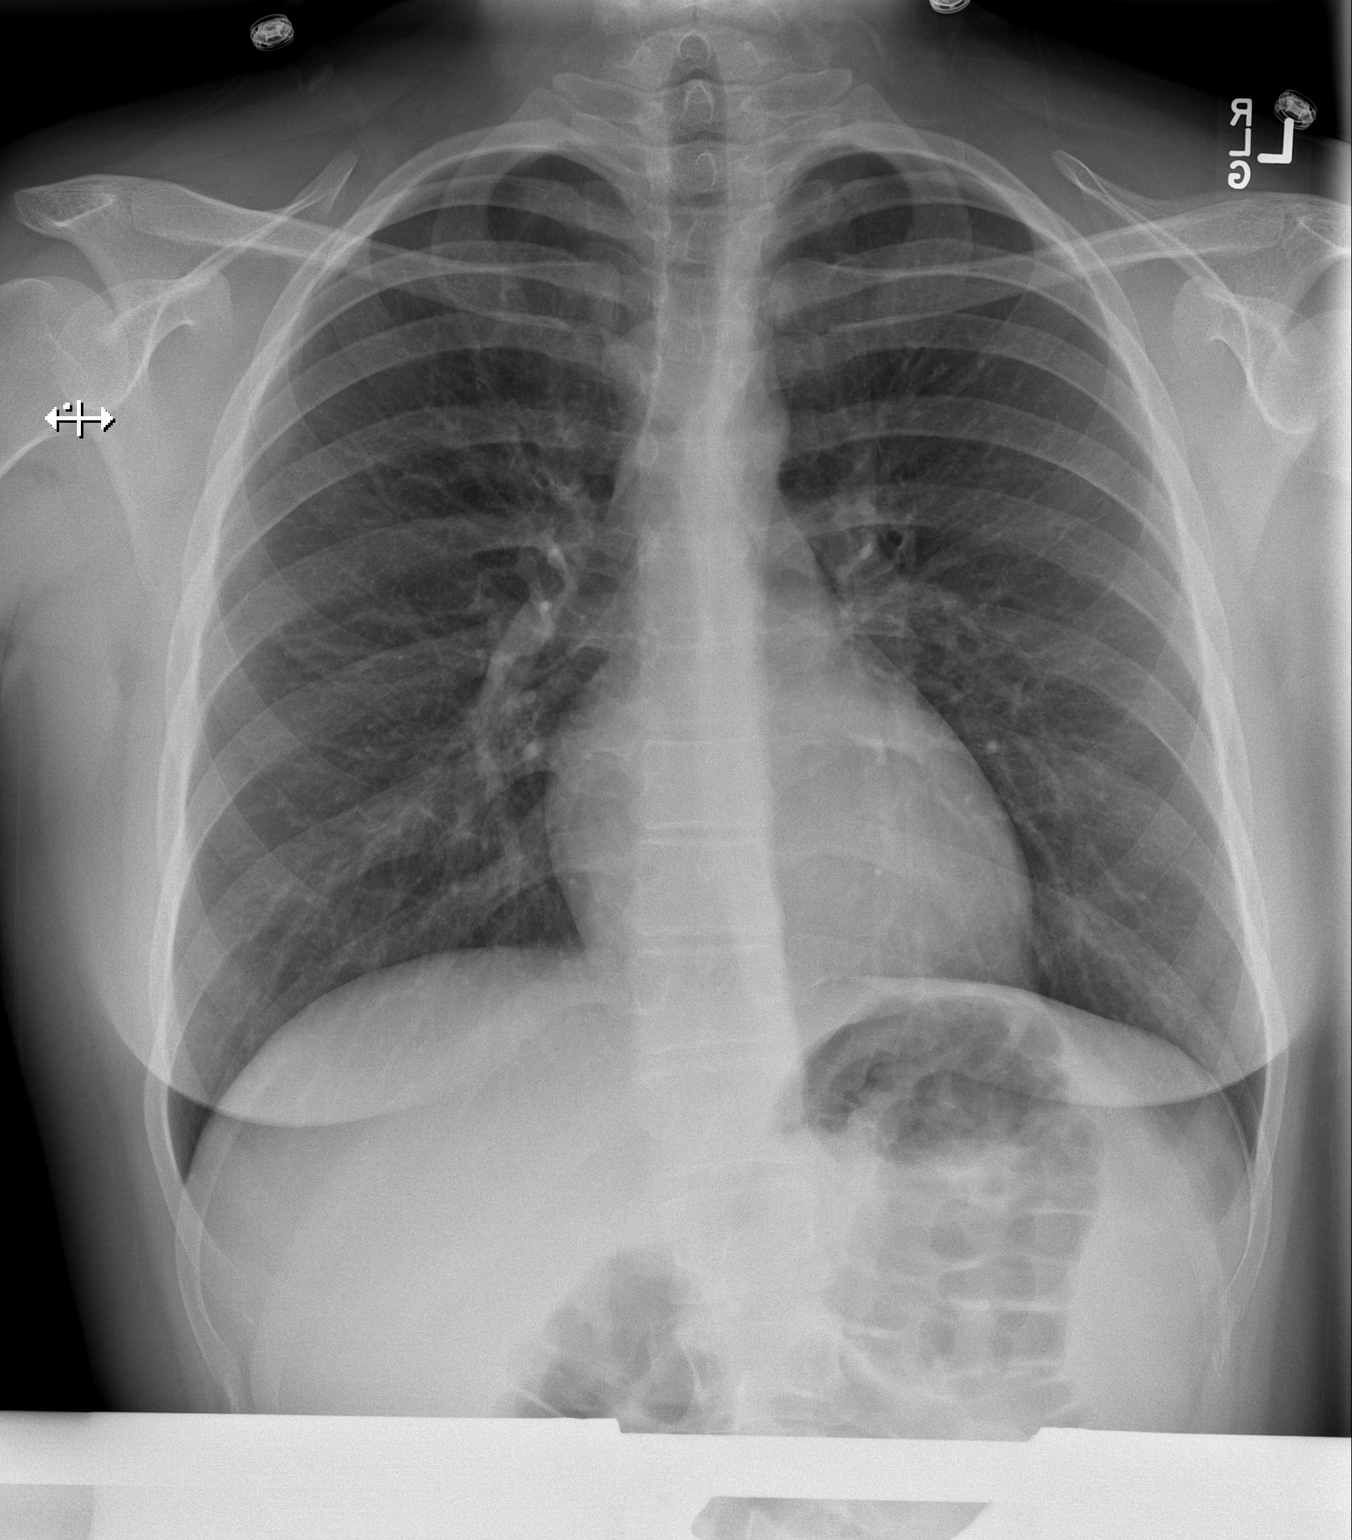

[w chest lat]
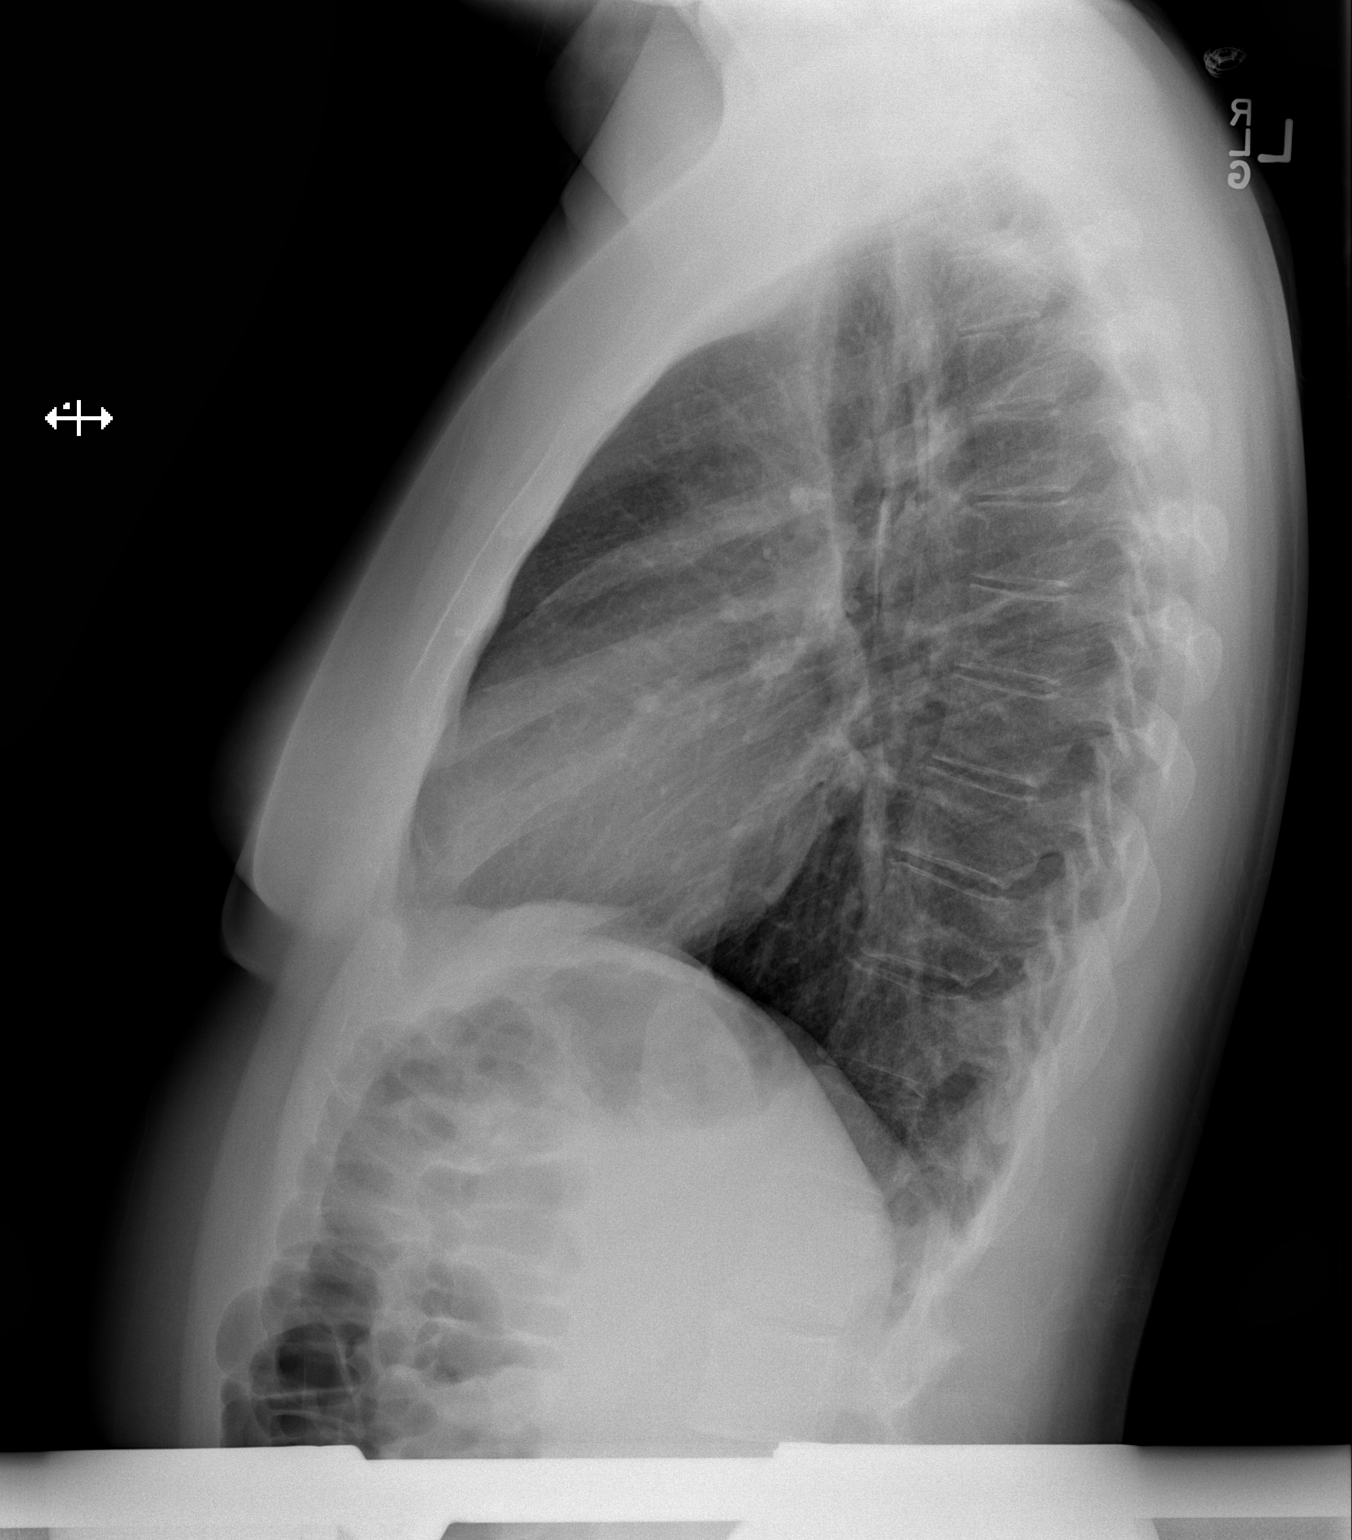

[2 of 2 positions shown; findings below may reference images not displayed]

FINDINGS: The heart size and mediastinal contours are within normal limits.
Both lungs are clear. The visualized skeletal structures are
unremarkable.
IMPRESSION: No active cardiopulmonary disease.

## 2018-09-07 ENCOUNTER — Other Ambulatory Visit: Payer: Self-pay | Admitting: Family Medicine

## 2018-09-07 DIAGNOSIS — R109 Unspecified abdominal pain: Secondary | ICD-10-CM

## 2018-09-08 ENCOUNTER — Ambulatory Visit
Admission: RE | Admit: 2018-09-08 | Discharge: 2018-09-08 | Disposition: A | Discharge status: Home / Self Care | Payer: BC Managed Care – PPO | Source: Ambulatory Visit | Attending: Family Medicine | Admitting: Family Medicine

## 2018-09-08 DIAGNOSIS — R109 Unspecified abdominal pain: Secondary | ICD-10-CM

## 2018-09-21 ENCOUNTER — Other Ambulatory Visit (HOSPITAL_COMMUNITY): Payer: Self-pay | Admitting: Physician Assistant

## 2018-09-21 ENCOUNTER — Other Ambulatory Visit: Payer: Self-pay | Admitting: Physician Assistant

## 2018-09-21 DIAGNOSIS — R1011 Right upper quadrant pain: Secondary | ICD-10-CM

## 2018-09-21 DIAGNOSIS — R112 Nausea with vomiting, unspecified: Secondary | ICD-10-CM

## 2018-10-04 ENCOUNTER — Encounter (HOSPITAL_COMMUNITY): Payer: Self-pay

## 2018-10-04 ENCOUNTER — Encounter (HOSPITAL_COMMUNITY)
Admission: RE | Admit: 2018-10-04 | Discharge: 2018-10-04 | Disposition: A | Discharge status: Home / Self Care | Payer: BC Managed Care – PPO | Source: Ambulatory Visit | Attending: Physician Assistant | Admitting: Physician Assistant

## 2018-10-04 ENCOUNTER — Other Ambulatory Visit: Payer: Self-pay

## 2018-10-04 DIAGNOSIS — R112 Nausea with vomiting, unspecified: Secondary | ICD-10-CM | POA: Insufficient documentation

## 2018-10-04 DIAGNOSIS — R1011 Right upper quadrant pain: Secondary | ICD-10-CM | POA: Insufficient documentation

## 2018-10-04 MED ORDER — TECHNETIUM TC 99M MEBROFENIN IV KIT
5.3500 | PACK | Freq: Once | INTRAVENOUS | Status: AC | PRN
  Administered 2018-10-04: 5.35 via INTRAVENOUS

## 2021-10-14 ENCOUNTER — Other Ambulatory Visit: Payer: Self-pay | Admitting: Family Medicine

## 2021-10-14 DIAGNOSIS — R1012 Left upper quadrant pain: Secondary | ICD-10-CM

## 2021-10-21 ENCOUNTER — Ambulatory Visit
Admission: RE | Admit: 2021-10-21 | Discharge: 2021-10-21 | Disposition: A | Discharge status: Home / Self Care | Payer: Medicaid Other | Source: Ambulatory Visit | Attending: Family Medicine | Admitting: Family Medicine

## 2021-10-21 DIAGNOSIS — R1012 Left upper quadrant pain: Secondary | ICD-10-CM

## 2022-03-13 ENCOUNTER — Other Ambulatory Visit: Payer: Self-pay

## 2022-07-06 ENCOUNTER — Other Ambulatory Visit: Payer: Self-pay | Admitting: Family Medicine

## 2022-07-06 DIAGNOSIS — N6310 Unspecified lump in the right breast, unspecified quadrant: Secondary | ICD-10-CM

## 2022-07-06 DIAGNOSIS — N644 Mastodynia: Secondary | ICD-10-CM

## 2022-07-23 ENCOUNTER — Ambulatory Visit
Admission: RE | Admit: 2022-07-23 | Discharge: 2022-07-23 | Disposition: A | Discharge status: Home / Self Care | Payer: Medicaid Other | Source: Ambulatory Visit | Attending: Family Medicine | Admitting: Family Medicine

## 2022-07-23 DIAGNOSIS — N6310 Unspecified lump in the right breast, unspecified quadrant: Secondary | ICD-10-CM

## 2022-07-23 DIAGNOSIS — N644 Mastodynia: Secondary | ICD-10-CM
